# Patient Record
Sex: Male | Born: 1980 | Race: Asian | Hispanic: No | Marital: Married | Smoking: Never smoker
Health system: Southern US, Community
[De-identification: ages and names within clinical notes are randomized; demographics above are authoritative.]

## PROBLEM LIST (undated history)

## (undated) DIAGNOSIS — K219 Gastro-esophageal reflux disease without esophagitis: Secondary | ICD-10-CM

## (undated) DIAGNOSIS — R413 Other amnesia: Secondary | ICD-10-CM

## (undated) HISTORY — PX: SURGERY OF LIP: SUR1315

## (undated) HISTORY — DX: Gastro-esophageal reflux disease without esophagitis: K21.9

## (undated) HISTORY — PX: DENTAL SURGERY: SHX609

## (undated) HISTORY — DX: Other amnesia: R41.3

---

## 2013-05-28 ENCOUNTER — Ambulatory Visit (INDEPENDENT_AMBULATORY_CARE_PROVIDER_SITE_OTHER): Payer: BC Managed Care – PPO | Admitting: Emergency Medicine

## 2013-05-28 VITALS — BP 118/72 | HR 95 | Temp 98.4°F | Resp 18 | Ht 65.0 in | Wt 139.0 lb

## 2013-05-28 DIAGNOSIS — R300 Dysuria: Secondary | ICD-10-CM

## 2013-05-28 DIAGNOSIS — J029 Acute pharyngitis, unspecified: Secondary | ICD-10-CM

## 2013-05-28 DIAGNOSIS — N476 Balanoposthitis: Secondary | ICD-10-CM

## 2013-05-28 DIAGNOSIS — R51 Headache: Secondary | ICD-10-CM

## 2013-05-28 DIAGNOSIS — N481 Balanitis: Secondary | ICD-10-CM

## 2013-05-28 DIAGNOSIS — R059 Cough, unspecified: Secondary | ICD-10-CM

## 2013-05-28 DIAGNOSIS — R05 Cough: Secondary | ICD-10-CM

## 2013-05-28 LAB — POCT URINALYSIS DIPSTICK
Bilirubin, UA: NEGATIVE
GLUCOSE UA: NEGATIVE
KETONES UA: NEGATIVE
Leukocytes, UA: NEGATIVE
Nitrite, UA: NEGATIVE
Spec Grav, UA: 1.01
UROBILINOGEN UA: 0.2
pH, UA: 7

## 2013-05-28 LAB — POCT UA - MICROSCOPIC ONLY
BACTERIA, U MICROSCOPIC: NEGATIVE
CASTS, UR, LPF, POC: NEGATIVE
Crystals, Ur, HPF, POC: NEGATIVE
Epithelial cells, urine per micros: NEGATIVE
Mucus, UA: NEGATIVE
RBC, URINE, MICROSCOPIC: NEGATIVE
WBC, Ur, HPF, POC: NEGATIVE
Yeast, UA: NEGATIVE

## 2013-05-28 LAB — POCT INFLUENZA A/B
Influenza A, POC: NEGATIVE
Influenza B, POC: NEGATIVE

## 2013-05-28 LAB — POCT SKIN KOH: Skin KOH, POC: NEGATIVE

## 2013-05-28 LAB — POCT RAPID STREP A (OFFICE): Rapid Strep A Screen: NEGATIVE

## 2013-05-28 MED ORDER — BENZONATATE 100 MG PO CAPS: 100.0000 mg | ORAL_CAPSULE | Freq: Three times a day (TID) | ORAL | Status: DC | PRN

## 2013-05-28 MED ORDER — KETOCONAZOLE 2 % EX CREA: 1.0000 "application " | TOPICAL_CREAM | Freq: Every day | CUTANEOUS | Status: DC

## 2013-05-28 NOTE — Progress Notes (Addendum)
Subjective:  This chart was scribed for Edwin Chris, MD by Quintella Reichert, ED scribe.  This patient was seen in [facility] Room 4 and the patient's care was started at 11:00 AM.   Patient ID: Edwin Hunter, male    DOB: 03/30/1981, 33 y.o.   MRN: [id number]   HPI  HPI Comments: Edwin Hunter is a 33 y.o. male who presents to [facility] complaining of headache and sore throat that began this morning with associated mild cough.  Pt states his entire family have been sick recently with similar symptoms.  He denies fever or generalized body aches.  Pt also complains of genital itching that began 4 months ago.  He describes an "itching" sensation under his foreskin with some whitish discharge to that area.  He has not been seen for this and he has not attempted any treatments pta.   There are no active problems to display for this patient.   History reviewed. No pertinent past medical history.  Past Surgical History  Procedure Laterality Date  . Dental surgery    . Surgery of lip      Prior to Admission medications   Not on File     Review of Systems  Constitutional: Negative for fever.  HENT: Positive for sore throat.   Respiratory: Positive for cough.   Genitourinary:       Genital itching and discharge  Musculoskeletal: Negative for myalgias.  Neurological: Positive for headaches.        Objective:   Physical Exam CONSTITUTIONAL: Well developed/well nourished HEAD: Normocephalic/atraumatic EYES: EOMI/PERRL ENMT: Mucous membranes moist NECK: supple no meningeal signs SPINE:entire spine nontender CV: S1/S2 noted, no murmurs/rubs/gallops noted LUNGS: Lungs are clear to auscultation bilaterally, no apparent distress ABDOMEN: soft, nontender, no rebound or guarding GU:no cva tenderness there is a whitish discharge underneath the foreskin. NEURO: Pt is awake/alert, moves all extremitiesx4 EXTREMITIES: pulses normal, full ROM SKIN: warm, color normal PSYCH: no abnormalities of  mood noted   Results for orders placed in visit on 05/28/13  POCT RAPID STREP A (OFFICE)      Result Value Range   Rapid Strep A Screen Negative  Negative  POCT INFLUENZA A/B      Result Value Range   Influenza A, POC Negative     Influenza B, POC Negative    POCT UA - MICROSCOPIC ONLY      Result Value Range   WBC, Ur, HPF, POC neg     RBC, urine, microscopic neg     Bacteria, U Microscopic neg     Mucus, UA neg     Epithelial cells, urine per micros neg     Crystals, Ur, HPF, POC neg     Casts, Ur, LPF, POC neg     Yeast, UA neg    POCT URINALYSIS DIPSTICK      Result Value Range   Color, UA yellow     Clarity, UA clear     Glucose, UA neg     Bilirubin, UA neg     Ketones, UA neg     Spec Grav, UA 1.010     Blood, UA trace-intact     pH, UA 7.0     Protein, UA trace     Urobilinogen, UA 0.2     Nitrite, UA neg     Leukocytes, UA Negative    POCT SKIN KOH      Result Value Range   Skin KOH, POC Negative  Assessment & Plan:  Clean area under foreskin antibiotic cream prescribed. You were given medication for cough.    I personally performed the services described in this documentation, which was scribed in my presence. The recorded information has been reviewed and is accurate.

## 2013-05-28 NOTE — Patient Instructions (Addendum)
Please clean the area under your foreskin once a day and apply the cream you have been prescribed. You have been given a medication to take for your coughBalanitis Balanitis is inflammation of the head of the penis (glans).  CAUSES  Balanitis has multiple causes, both infectious and noninfectious. Frequently balanitis is the result of poor personal hygiene, especially in uncircumcised males. Without adequate washing, viruses, bacteria, and yeast collect between the foreskin and the glans. This can cause an infection. Lack of air and irritation from a normal secretion called smegma contribute to the cause in uncircumcised males. Other causes include:  Chemical irritation from the use of certain soaps and shower gels (especially soaps with perfumes), condoms, personal lubricants, petroleum jelly, spermicides, and fabric conditioners.  Skin conditions, such as eczema, dermatitis, and psoriasis.  Allergies to drugs, such as tetracycline and sulfa.  Certain medical conditions, including liver cirrhosis, congestive heart failure, and kidney disease.  Morbid obesity. RISK FACTORS  Diabetes mellitus.  Phimosis A tight foreskin that is difficult to pull back past the glans.  Sex without the use of a condom. SIGNS AND SYMPTOMS  Symptoms may include:  Discharge coming from under the foreskin.  Tenderness.  Itching and inability to get an erection (because of the pain).  Redness and a rash.  Sores on the glans and on the foreskin. DIAGNOSIS Diagnosis of balanitis is confirmed through a physical exam. TREATMENT The treatment is based on the cause of the balanitis. Treatment may include frequent cleansing, keeping the glans and foreskin dry, use of medicines such as creams, pain medicines, antibiotics, or medicines to treat fungal infections. Sitz baths may be used. If the irritation has caused a scar on the foreskin that prevents easy retraction, a circumcision may be recommended.  HOME CARE  INSTRUCTIONS  Sex should be avoided until the condition has cleared. Document Released: 09/29/2008 Document Revised: 01/13/2013 Document Reviewed: 11/02/2012 [facility] Patient Information 2014 [place], [place].

## 2013-10-23 ENCOUNTER — Ambulatory Visit (INDEPENDENT_AMBULATORY_CARE_PROVIDER_SITE_OTHER): Payer: BC Managed Care – PPO | Admitting: Family Medicine

## 2013-10-23 VITALS — BP 110/82 | HR 82 | Temp 97.7°F | Resp 16 | Ht 64.5 in | Wt 142.4 lb

## 2013-10-23 DIAGNOSIS — R1013 Epigastric pain: Secondary | ICD-10-CM

## 2013-10-23 DIAGNOSIS — L299 Pruritus, unspecified: Secondary | ICD-10-CM

## 2013-10-23 DIAGNOSIS — G8929 Other chronic pain: Secondary | ICD-10-CM

## 2013-10-23 DIAGNOSIS — H1010 Acute atopic conjunctivitis, unspecified eye: Secondary | ICD-10-CM

## 2013-10-23 DIAGNOSIS — J309 Allergic rhinitis, unspecified: Secondary | ICD-10-CM

## 2013-10-23 DIAGNOSIS — K219 Gastro-esophageal reflux disease without esophagitis: Secondary | ICD-10-CM

## 2013-10-23 DIAGNOSIS — H1045 Other chronic allergic conjunctivitis: Secondary | ICD-10-CM

## 2013-10-23 MED ORDER — PANTOPRAZOLE SODIUM 40 MG PO TBEC: 40.0000 mg | DELAYED_RELEASE_TABLET | Freq: Every day | ORAL | Status: DC

## 2013-10-23 NOTE — Progress Notes (Signed)
Subjective: 33 year old man who has been having heartburn for many years. He came here some years ago and was given medication which did not seem to help a whole lot. He has not had any evaluation or testing for this. It bothers him sometimes when he lies down. It also so hurts very bad if he eats hot peppers or spicy foods. He has also complaints of symptoms up into his nose and a bad taste up in his nose and mouth. He has congestion in his nares, itching of his eyes, and itching of the skin on his arms at times. He does not smoke. Is not on any regular medications. He works for [employer] ([employer]ibility and [employer]etics). 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 The epigastric pain occurs daily. Objective: Healthy-appearing Asian male. Eyes slightly injected. Nose congested. Throat clear. Neck supple without nodes. Chest is clear to auscultation. Heart regular without murmurs gallops or arrhythmias. Abdomen soft without mass but is tender in the hypogastrium. Extremities otherwise normal.  Assessment: GERD Probable allergic conjunctivitis and allergic rhinitis Itching  Plan: Loratadine one daily OTC Omeprazole 40 mg daily Check H. pylori test.

## 2013-10-23 NOTE — Patient Instructions (Addendum)
The blood test is to check for a type of infection that can cause a lot of heartburn. If it shows that you have an infection we will have to treat you with some additional medicines for a couple of weeks. We will call you the results of the tests in a few days.  Take the protonix 40 mg one pill each night before bed for heartburn. It may take a few days before it starts doing better.  Take over-the-counter loratadine (Claritin) one daily for the eyes and nose and skin itching. I believe you have allergies causing this.  Return if worse or if not improving. If you keep having more problems he may need to have additional tests looking down into the stomach with a light by a gastroenterology specialist.

## 2013-10-26 LAB — HELICOBACTER PYLORI  ANTIBODY, IGM: HELICOBACTER PYLORI, IGM: 6.1 U/mL (ref <9.0)

## 2013-10-27 ENCOUNTER — Encounter: Payer: Self-pay | Admitting: Family Medicine

## 2013-11-30 ENCOUNTER — Emergency Department (INDEPENDENT_AMBULATORY_CARE_PROVIDER_SITE_OTHER)
Admission: EM | Admit: 2013-11-30 | Discharge: 2013-11-30 | Disposition: A | Discharge status: Home / Self Care | Payer: Self-pay | Source: Home / Self Care | Attending: Family Medicine | Admitting: Family Medicine

## 2013-11-30 ENCOUNTER — Encounter (HOSPITAL_COMMUNITY): Payer: Self-pay | Admitting: Emergency Medicine

## 2013-11-30 DIAGNOSIS — S139XXA Sprain of joints and ligaments of unspecified parts of neck, initial encounter: Secondary | ICD-10-CM

## 2013-11-30 DIAGNOSIS — S134XXA Sprain of ligaments of cervical spine, initial encounter: Secondary | ICD-10-CM

## 2013-11-30 MED ORDER — DICLOFENAC SODIUM 75 MG PO TBEC: 75.0000 mg | DELAYED_RELEASE_TABLET | Freq: Two times a day (BID) | ORAL | Status: DC
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
## 2013-12-01 NOTE — ED Provider Notes (Signed)
Medical screening examination/treatment/procedure(s) were performed by resident physician or non-physician practitioner and as supervising physician I was immediately available for consultation/collaboration.   KINDL,JAMES DOUGLAS MD.   James D Kindl, MD 12/01/13 2056 

## 2013-12-14 ENCOUNTER — Encounter (HOSPITAL_COMMUNITY): Payer: Self-pay | Admitting: Emergency Medicine

## 2013-12-14 ENCOUNTER — Emergency Department (INDEPENDENT_AMBULATORY_CARE_PROVIDER_SITE_OTHER)
Admission: EM | Admit: 2013-12-14 | Discharge: 2013-12-14 | Disposition: A | Discharge status: Home / Self Care | Payer: Self-pay | Source: Home / Self Care | Attending: Family Medicine | Admitting: Family Medicine

## 2013-12-14 DIAGNOSIS — R51 Headache: Secondary | ICD-10-CM

## 2013-12-14 DIAGNOSIS — M549 Dorsalgia, unspecified: Secondary | ICD-10-CM

## 2013-12-14 DIAGNOSIS — R519 Headache, unspecified: Secondary | ICD-10-CM

## 2013-12-14 DIAGNOSIS — R413 Other amnesia: Secondary | ICD-10-CM

## 2013-12-14 MED ORDER — IBUPROFEN 600 MG PO TABS: 600.0000 mg | ORAL_TABLET | Freq: Four times a day (QID) | ORAL | Status: DC | PRN

## 2013-12-14 MED ORDER — TRAMADOL HCL 50 MG PO TABS: 50.0000 mg | ORAL_TABLET | Freq: Four times a day (QID) | ORAL | Status: DC | PRN

## 2013-12-14 NOTE — Discharge Instructions (Signed)
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
## 2013-12-15 NOTE — ED Provider Notes (Signed)
Medical screening examination/treatment/procedure(s) were performed by resident physician or non-physician practitioner and as supervising physician I was immediately available for consultation/collaboration.   Mihcael Ledee DOUGLAS MD.   Luie Laneve D Gennaro Lizotte, MD 12/15/13 2138 

## 2014-01-25 ENCOUNTER — Ambulatory Visit (INDEPENDENT_AMBULATORY_CARE_PROVIDER_SITE_OTHER): Payer: Self-pay | Admitting: Neurology

## 2014-01-25 DIAGNOSIS — R413 Other amnesia: Secondary | ICD-10-CM

## 2014-01-26 NOTE — Progress Notes (Signed)
No charge. 

## 2014-03-03 ENCOUNTER — Encounter: Payer: Self-pay | Admitting: Neurology

## 2014-03-03 ENCOUNTER — Ambulatory Visit (INDEPENDENT_AMBULATORY_CARE_PROVIDER_SITE_OTHER): Payer: Self-pay | Admitting: Neurology

## 2014-03-03 VITALS — BP 136/80 | HR 97 | Ht 64.0 in | Wt 143.5 lb

## 2014-03-03 DIAGNOSIS — F0781 Postconcussional syndrome: Secondary | ICD-10-CM | POA: Insufficient documentation

## 2014-03-03 DIAGNOSIS — G44321 Chronic post-traumatic headache, intractable: Secondary | ICD-10-CM | POA: Insufficient documentation

## 2014-03-03 MED ORDER — NORTRIPTYLINE HCL 10 MG PO CAPS: 10.0000 mg | ORAL_CAPSULE | Freq: Every day | ORAL | Status: DC

## 2014-03-03 NOTE — Patient Instructions (Signed)
You have post-concussion syndrome  1. We will get ct of the head 2.  XR of the cervical spine 3.  Refer to neuropsychologic testing 4.  Start nortriptyline 10mg  at bedtime.  Side effects include sleepiness, dizziness, insomnia or dry mouth.  Call in 4 weeks with update. 5.  Take naproxen (Aleve) 500mg  up to twice daily every 12 hours.  Do not take more than 2 days out of the week. 6.  [facility] 7.  Follow up in 3 months or after neuropsych testing.
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
## 2014-03-07 ENCOUNTER — Encounter (HOSPITAL_COMMUNITY): Payer: Self-pay

## 2014-03-07 ENCOUNTER — Ambulatory Visit (HOSPITAL_COMMUNITY)
Admission: RE | Admit: 2014-03-07 | Discharge: 2014-03-07 | Disposition: A | Discharge status: Home / Self Care | Payer: Self-pay | Source: Ambulatory Visit | Attending: Neurology | Admitting: Neurology

## 2014-03-07 DIAGNOSIS — Z87828 Personal history of other (healed) physical injury and trauma: Secondary | ICD-10-CM | POA: Insufficient documentation

## 2014-03-07 DIAGNOSIS — F0781 Postconcussional syndrome: Secondary | ICD-10-CM | POA: Insufficient documentation

## 2014-03-07 DIAGNOSIS — R51 Headache: Secondary | ICD-10-CM | POA: Insufficient documentation

## 2014-03-07 DIAGNOSIS — G44321 Chronic post-traumatic headache, intractable: Secondary | ICD-10-CM

## 2014-03-09 ENCOUNTER — Telehealth: Payer: Self-pay | Admitting: Neurology

## 2014-03-09 NOTE — Telephone Encounter (Signed)
Do we have results on CT head and DG spine patient had done on 03/07/14 and is asking for results

## 2014-03-09 NOTE — Telephone Encounter (Signed)
Yes.  I sent a message saying that they were unremarkable.

## 2014-03-09 NOTE — Telephone Encounter (Signed)
Patient is aware of normal CT and xray

## 2014-03-09 NOTE — Telephone Encounter (Signed)
Pt called f/u on his X-Ray results. Please call pt # [phone]

## 2014-03-27 ENCOUNTER — Other Ambulatory Visit: Payer: Self-pay | Admitting: Neurology

## 2014-06-03 ENCOUNTER — Ambulatory Visit (INDEPENDENT_AMBULATORY_CARE_PROVIDER_SITE_OTHER): Payer: Self-pay | Admitting: Neurology

## 2014-06-03 ENCOUNTER — Encounter: Payer: Self-pay | Admitting: Neurology

## 2014-06-03 VITALS — BP 110/78 | HR 99 | Resp 16 | Ht 64.0 in | Wt 148.0 lb

## 2014-06-03 DIAGNOSIS — F0781 Postconcussional syndrome: Secondary | ICD-10-CM

## 2014-06-03 DIAGNOSIS — G44329 Chronic post-traumatic headache, not intractable: Secondary | ICD-10-CM
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
## 2014-06-03 NOTE — Patient Instructions (Signed)
Continue nortriptyline 10mg  at bedtime Take Tylenol for headache Follow up in 6 months.

## 2014-06-14 ENCOUNTER — Telehealth: Payer: Self-pay | Admitting: Neurology

## 2014-06-14 NOTE — Telephone Encounter (Signed)
recv'd records request by mail from Clydene LamingJami Forbes (attorney). Request forwarded via interoffice mail to HIM @ [facility] / Sherri S.

## 2014-12-02 ENCOUNTER — Ambulatory Visit: Payer: Self-pay | Admitting: Neurology
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
## 2018-08-24 ENCOUNTER — Ambulatory Visit (HOSPITAL_COMMUNITY)
Admission: EM | Admit: 2018-08-24 | Discharge: 2018-08-24 | Disposition: A | Discharge status: Home / Self Care | Payer: BLUE CROSS/BLUE SHIELD | Attending: Family Medicine | Admitting: Family Medicine

## 2018-08-24 ENCOUNTER — Encounter (HOSPITAL_COMMUNITY): Payer: Self-pay | Admitting: Emergency Medicine

## 2018-08-24 ENCOUNTER — Other Ambulatory Visit: Payer: Self-pay

## 2018-08-24 ENCOUNTER — Ambulatory Visit: Payer: Self-pay

## 2018-08-24 DIAGNOSIS — J069 Acute upper respiratory infection, unspecified: Secondary | ICD-10-CM | POA: Diagnosis not present

## 2018-08-24 DIAGNOSIS — B9789 Other viral agents as the cause of diseases classified elsewhere: Secondary | ICD-10-CM

## 2018-08-24 MED ORDER — IBUPROFEN 600 MG PO TABS: 600.0000 mg | ORAL_TABLET | Freq: Four times a day (QID) | ORAL | 0 refills | Status: AC | PRN

## 2018-08-24 MED ORDER — PSEUDOEPH-BROMPHEN-DM 30-2-10 MG/5ML PO SYRP: 5.0000 mL | ORAL_SOLUTION | Freq: Four times a day (QID) | ORAL | 0 refills | Status: AC | PRN

## 2018-08-24 MED ORDER — CETIRIZINE HCL 10 MG PO CAPS: 10.0000 mg | ORAL_CAPSULE | Freq: Every day | ORAL | 0 refills | Status: AC

## 2018-08-24 NOTE — ED Provider Notes (Signed)
[facility]    CSN: [id number] Arrival date & time: 08/24/18  1401     History   Chief Complaint Chief Complaint  Patient presents with  . Generalized Body Aches  . Cough    HPI Edwin Hunter is a 38 y.o. male no significant past medical history presenting today for evaluation of sore throat cough and chest discomfort.  Patient states that over the past 2 to 3 weeks he has had a sore throat.  He is also had a sensation of feeling mucus in his throat.  Over the past day he has developed a discomfort in his right chest, more prominent at nighttime/lying flat.  Cough is mild, occasionally productive, occasionally dry.  Denies any known fevers.  Does have some body aches and chills at nighttime.Denies any recent travel, denies any known exposure to COVID-19.  Denies close contacts with similar symptoms.  Patient has continued to work at [employer]  Took Tylenol for symptoms.  HPI  Past Medical History:  Diagnosis Date  . GERD (gastroesophageal reflux disease)   . Memory loss     Patient Active Problem List   Diagnosis Date Noted  . Postconcussion syndrome 03/03/2014  . 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 DENTAL SURGERY    . SURGERY OF LIP         Home Medications    Prior to Admission medications   Medication Sig Start Date End Date Taking? Authorizing Provider  brompheniramine-pseudoephedrine-DM 30-2-10 MG/5ML syrup Take 5 mLs by mouth 4 (four) times daily as needed. 08/24/18   Jearl Soto C, PA-C  Cetirizine HCl 10 MG CAPS Take 1 capsule (10 mg total) by mouth daily. 08/24/18   Sheyenne Konz C, PA-C  ibuprofen (ADVIL,MOTRIN) 600 MG tablet Take 1 tablet (600 mg total) by mouth every 6 (six) hours as needed. 08/24/18   Chantea Surace C, PA-C  nortriptyline (PAMELOR) 10 MG capsule TAKE 1 CAPSULE (10 MG TOTAL) BY MOUTH AT BEDTIME. 03/28/14   Drema Dallas, DO    Family  History History reviewed. No pertinent family history.  Social History Social History   Tobacco Use  . Smoking status: Never Smoker  Substance Use Topics  . Alcohol use: No  . Drug use: No     Allergies   Patient has no known allergies.   Review of Systems Review of Systems  Constitutional: Positive for chills. Negative for activity change, appetite change, fatigue and fever.  HENT: Positive for congestion, rhinorrhea and sore throat. Negative for ear pain, sinus pressure and trouble swallowing.   Eyes: Negative for discharge and redness.  Respiratory: Positive for cough. Negative for chest tightness and shortness of breath.   Cardiovascular: Positive for chest pain.  Gastrointestinal: Negative for abdominal pain, diarrhea, nausea and vomiting.  Musculoskeletal: Positive for myalgias.  Skin: Negative for rash.  Neurological: Negative for dizziness, light-headedness and headaches.     Physical Exam Triage Vital Signs ED Triage Vitals  Enc Vitals Group     BP 08/24/18 1431 (!) 129/99     Pulse Rate 08/24/18 1431 98     Resp 08/24/18 1431 18     Temp 08/24/18 1431 98.6 F (37 C)     Temp Source 08/24/18 1431 Oral     SpO2 08/24/18 1431 100 %     Weight --      Height --      Head Circumference --  Peak Flow --      Pain Score 08/24/18 1432 2     Pain Loc --      Pain Edu? --      Excl. in GC? --    No data found.  Updated Vital Signs BP (!) 129/99 (BP Location: Right Arm)   Pulse 98   Temp 98.6 F (37 C) (Oral)   Resp 18   SpO2 100%   Visual Acuity Right Eye Distance:   Left Eye Distance:   Bilateral Distance:    Right Eye Near:   Left Eye Near:    Bilateral Near:     Physical Exam Vitals signs and nursing note reviewed.  Constitutional:      Appearance: He is well-developed.  HENT:     Head: Normocephalic and atraumatic.     Ears:     Comments: Bilateral ears without tenderness to palpation of external auricle, tragus and mastoid, EAC's  without erythema or swelling, TM's with good bony landmarks and cone of light. Non erythematous.     Mouth/Throat:     Comments: Oral mucosa pink and moist, no tonsillar enlargement or exudate. Posterior pharynx patent and nonerythematous, no uvula deviation or swelling. Normal phonation. Eyes:     Conjunctiva/sclera: Conjunctivae normal.  Neck:     Musculoskeletal: Neck supple.  Cardiovascular:     Rate and Rhythm: Normal rate and regular rhythm.     Heart sounds: No murmur.  Pulmonary:     Effort: Pulmonary effort is normal. No respiratory distress.     Breath sounds: Normal breath sounds.     Comments: Breathing comfortably at rest, CTABL, no wheezing, rales or other adventitious sounds auscultated Nontender to palpation of anterior chest Chest:     Chest wall: No tenderness.  Abdominal:     Palpations: Abdomen is soft.     Tenderness: There is no abdominal tenderness.  Skin:    General: Skin is warm and dry.  Neurological:     Mental Status: He is alert.      UC Treatments / Results  Labs (all labs ordered are listed, but only abnormal results are displayed) Labs Reviewed - No data to display  EKG None  Radiology No results found.  Procedures Procedures (including critical care time)  Medications Ordered in UC Medications - No data to display  Initial Impression / Assessment and Plan / UC Course  I have reviewed the triage vital signs and the nursing notes.  Pertinent labs & imaging results that were available during my care of the patient were reviewed by me and considered in my medical decision making (see chart for details).    Patient with sore throat x2 to 3 weeks, likely related to postnasal drainage and allergies, will initiate on daily antihistamine, Zyrtec provided.  Cough is mild, vital signs stable, oxygen 100%, no tachycardia, lungs clear.  Do not suspect underlying pneumonia at this time.  Possible COVID-19.  Will recommend self quarantining,  symptomatic and supportive care.  Continue to monitor discomfort in chest and follow-up if symptoms still persistent or worsening.Discussed strict return precautions. Patient verbalized understanding and is agreeable with plan.   Final Clinical Impressions(s) / UC Diagnoses   Final diagnoses:  Viral URI with cough     Discharge Instructions     You likely having a viral upper respiratory infection. We recommended symptom control. I expect your symptoms to start improving in the next 1-2 weeks.   1. Take a daily allergy pill/anti-histamine like  Zyrtec, Claritin, or Store brand consistently for 2 weeks 2. For congestion you may try an oral decongestant like Mucinex or sudafed. You may also try intranasal flonase nasal spray or saline irrigations (neti pot, sinus cleanse) 3. For your sore throat you may try cepacol lozenges, salt water gargles, throat spray. Treatment of congestion may also help your sore throat. 4. For cough you may try cough syrup provided or over the counter Delsym, Robitussen, Mucinex DM 5. Take Tylenol or Ibuprofen to help with pain/inflammation 6. Stay hydrated, drink plenty of fluids to keep throat coated and less irritated  Honey Tea For cough/sore throat try using a honey-based tea. Use 3 teaspoons of honey with juice squeezed from half lemon. Place shaved pieces of ginger into 1/2-1 cup of water and warm over stove top. Then mix the ingredients and repeat every 4 hours as needed.  Follow up if symptoms not improving or worsening   ED Prescriptions    Medication Sig Dispense Auth. Provider   Cetirizine HCl 10 MG CAPS Take 1 capsule (10 mg total) by mouth daily. 20 capsule Adalai Perl C, PA-C   ibuprofen (ADVIL,MOTRIN) 600 MG tablet Take 1 tablet (600 mg total) by mouth every 6 (six) hours as needed. 30 tablet Rejoice Heatwole C, PA-C   brompheniramine-pseudoephedrine-DM 30-2-10 MG/5ML syrup Take 5 mLs by mouth 4 (four) times daily as needed. 120 mL Emiah Pellicano,  Alexus Michael C, PA-C     Controlled Substance Prescriptions [place] Controlled Substance Registry consulted? Not Applicable   Lew DawesWieters, Bufford Helms C, [place]PA-C 08/24/18 1506

## 2018-08-24 NOTE — Discharge Instructions (Signed)
You likely having a viral upper respiratory infection. We recommended symptom control. I expect your symptoms to start improving in the next 1-2 weeks.   1. Take a daily allergy pill/anti-histamine like Zyrtec, Claritin, or Store brand consistently for 2 weeks 2. For congestion you may try an oral decongestant like Mucinex or sudafed. You may also try intranasal flonase nasal spray or saline irrigations (neti pot, sinus cleanse) 3. For your sore throat you may try cepacol lozenges, salt water gargles, throat spray. Treatment of congestion may also help your sore throat. 4. For cough you may try cough syrup provided or over the counter Delsym, Robitussen, Mucinex DM 5. Take Tylenol or Ibuprofen to help with pain/inflammation 6. Stay hydrated, drink plenty of fluids to keep throat coated and less irritated  Honey Tea For cough/sore throat try using a honey-based tea. Use 3 teaspoons of honey with juice squeezed from half lemon. Place shaved pieces of ginger into 1/2-1 cup of water and warm over stove top. Then mix the ingredients and repeat every 4 hours as needed.  Follow up if symptoms not improving or worsening

## 2018-08-24 NOTE — Telephone Encounter (Signed)
Pt c/o right side chest pain(Lek served as translator)that kept pt from sleeping last night. Pt rates pain at 7/10. Pt stated that his pain goes to his back. Pt also stated that when the chest pain is worse, he gets cold chills and when having no chest pain no cold chills. Pt stated the pain was constant but not sure due to cold chill symptoms that occur only with chest pain. Pt having no pain to left chest or pain to arms, neck or jaw. Pt stated it does not hurt when he takes a deep breath. Pt also c/o itchy sore throat. Called [facility] to ask about making appt. Spoke with FC Anitra who advised pt go to [facility]. Informed pt that he needs to go to [facility]. Attempted to give care advice but pt's interpreter thanked me and hung up. Unsure if pt will go to [facility].  Reason for Disposition . Chest pain lasts > 5 minutes (Exceptions: chest pain occurring > 3 days ago and now asymptomatic; same as previously diagnosed heartburn and has accompanying sour taste in mouth)  Answer Assessment - Initial Assessment Questions 1. LOCATION: "Where does it hurt?"       All over chest especially on right side 2. RADIATION: "Does the pain go anywhere else?" (e.g., into neck, jaw, arms, back)    "Hurting everywhere"  Goes through to back-  3. ONSET: "When did the chest pain begin?" (Minutes, hours or days)      yesterday 4. PATTERN "Does the pain come and go, or has it been constant since it started?"  "Does it get worse with exertion?"      Would come and go now the pain is constant since he woke this morning -moving and walking doesn't hurt trying to sleep makes it worse 5. DURATION: "How long does it last" (e.g., seconds, minutes, hours)     Constant now 6. SEVERITY: "How bad is the pain?"  (e.g., Scale 1-10; mild, moderate, or severe)    - MILD (1-3): doesn't interfere with normal activities     - MODERATE (4-7): interferes with normal activities or awakens from sleep    - SEVERE (8-10): excruciating pain, unable to do any  normal activities       Moderate kept him up all night 7. CARDIAC RISK FACTORS: "Do you have any history of heart problems or risk factors for heart disease?" (e.g., prior heart attack, angina; high blood pressure, diabetes, being overweight, high cholesterol, smoking, or strong family history of heart disease)    no 8. PULMONARY RISK FACTORS: "Do you have any history of lung disease?"  (e.g., blood clots in lung, asthma, emphysema, birth control pills)     no 9. CAUSE: "What do you think is causing the chest pain?"     Pt doesn't know 10. OTHER SYMPTOMS: "Do you have any other symptoms?" (e.g., dizziness, nausea, vomiting, sweating, fever, difficulty breathing, cough)       No other symptoms, sore throat and itchy throat-has cold chills when chest pain and no cold symptoms 11. PREGNANCY: "Is there any chance you are pregnant?" "When was your last menstrual period?"       n/a  Protocols used: CHEST PAIN-A-AH

## 2018-08-24 NOTE — ED Triage Notes (Signed)
Pt here for body aches and cough; pt sts pain in right chest and some chills at night

## 2018-08-25 NOTE — Telephone Encounter (Signed)
Pt was seen in the ER on 08/24/2018 for evaluation and treatment.
